# Patient Record
Sex: Female | Born: 2000 | Race: Black or African American | Hispanic: No | Marital: Single | Smoking: Never smoker
Health system: Southern US, Community
[De-identification: ages and names within clinical notes are randomized; demographics above are authoritative.]

---
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
## 2020-01-14 ENCOUNTER — Other Ambulatory Visit: Payer: Self-pay

## 2020-01-14 ENCOUNTER — Emergency Department
Admission: EM | Admit: 2020-01-14 | Discharge: 2020-01-14 | Disposition: A | Discharge status: Home / Self Care | Payer: Medicaid Other | Attending: Emergency Medicine | Admitting: Emergency Medicine

## 2020-01-14 ENCOUNTER — Emergency Department
Admission: EM | Admit: 2020-01-14 | Discharge: 2020-01-14 | Disposition: A | Discharge status: Home / Self Care | Payer: Medicaid Other | Source: Home / Self Care | Attending: Emergency Medicine | Admitting: Emergency Medicine

## 2020-01-14 ENCOUNTER — Emergency Department: Payer: Medicaid Other

## 2020-01-14 ENCOUNTER — Encounter: Payer: Self-pay | Admitting: Emergency Medicine

## 2020-01-14 DIAGNOSIS — S80212A Abrasion, left knee, initial encounter: Secondary | ICD-10-CM | POA: Diagnosis not present

## 2020-01-14 DIAGNOSIS — Y999 Unspecified external cause status: Secondary | ICD-10-CM | POA: Insufficient documentation

## 2020-01-14 DIAGNOSIS — S0990XA Unspecified injury of head, initial encounter: Secondary | ICD-10-CM | POA: Diagnosis not present

## 2020-01-14 DIAGNOSIS — S0083XA Contusion of other part of head, initial encounter: Secondary | ICD-10-CM

## 2020-01-14 DIAGNOSIS — Y929 Unspecified place or not applicable: Secondary | ICD-10-CM | POA: Insufficient documentation

## 2020-01-14 DIAGNOSIS — Z23 Encounter for immunization: Secondary | ICD-10-CM | POA: Insufficient documentation

## 2020-01-14 DIAGNOSIS — S0181XA Laceration without foreign body of other part of head, initial encounter: Secondary | ICD-10-CM | POA: Insufficient documentation

## 2020-01-14 DIAGNOSIS — Y939 Activity, unspecified: Secondary | ICD-10-CM | POA: Insufficient documentation

## 2020-01-14 DIAGNOSIS — S60511A Abrasion of right hand, initial encounter: Secondary | ICD-10-CM | POA: Insufficient documentation

## 2020-01-14 DIAGNOSIS — S40812A Abrasion of left upper arm, initial encounter: Secondary | ICD-10-CM | POA: Diagnosis not present

## 2020-01-14 DIAGNOSIS — S60512A Abrasion of left hand, initial encounter: Secondary | ICD-10-CM | POA: Diagnosis not present

## 2020-01-14 DIAGNOSIS — S40811A Abrasion of right upper arm, initial encounter: Secondary | ICD-10-CM | POA: Insufficient documentation

## 2020-01-14 DIAGNOSIS — S80211A Abrasion, right knee, initial encounter: Secondary | ICD-10-CM | POA: Insufficient documentation

## 2020-01-14 DIAGNOSIS — T07XXXA Unspecified multiple injuries, initial encounter: Secondary | ICD-10-CM

## 2020-01-14 LAB — POCT PREGNANCY, URINE: Preg Test, Ur: NEGATIVE

## 2020-01-14 MED ORDER — LIDOCAINE-EPINEPHRINE-TETRACAINE (LET) TOPICAL GEL
3.0000 mL | Freq: Once | TOPICAL | Status: AC
  Administered 2020-01-14: 3 mL via TOPICAL
  Filled 2020-01-14: qty 3

## 2020-01-14 MED ORDER — CEPHALEXIN 500 MG PO CAPS: 500.0000 mg | ORAL_CAPSULE | Freq: Three times a day (TID) | ORAL | 0 refills | Status: AC

## 2020-01-14 MED ORDER — TETANUS-DIPHTH-ACELL PERTUSSIS 5-2.5-18.5 LF-MCG/0.5 IM SUSP
0.5000 mL | Freq: Once | INTRAMUSCULAR | Status: AC
  Administered 2020-01-14: 0.5 mL via INTRAMUSCULAR
  Filled 2020-01-14: qty 0.5

## 2020-01-14 MED ORDER — LIDOCAINE HCL (PF) 1 % IJ SOLN
5.0000 mL | Freq: Once | INTRAMUSCULAR | Status: DC
  Filled 2020-01-14: qty 5

## 2020-01-14 NOTE — ED Triage Notes (Signed)
Pt called from [facility] to treatment room x's 3, no response 

## 2020-01-14 NOTE — ED Triage Notes (Signed)
Pt called from [facility] to treatment room, no response 

## 2020-01-14 NOTE — Discharge Instructions (Addendum)
Read head injury directions for information only.  Return to the emergency department if any severe worsening or problems in relationship to the information.  Your CT scan today was negative and no skull fracture was seen.  A prescription for Keflex was sent to the pharmacy to help prevent infection.  He will need to clean the abrasions daily with mild soap and water.  The laceration to your face was sutured with material that will come out on its own.  You do not need to have them removed at a doctor's office or urgent care.  You may take Tylenol as needed for pain, soreness or headache.
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
## 2020-01-14 NOTE — ED Notes (Signed)
Called for patient in waiting room.  No response.
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
## 2024-04-13 ENCOUNTER — Encounter: Payer: Self-pay | Admitting: *Deleted

## 2024-04-13 ENCOUNTER — Other Ambulatory Visit: Payer: Self-pay

## 2024-04-13 ENCOUNTER — Emergency Department
Admission: EM | Admit: 2024-04-13 | Discharge: 2024-04-13 | Disposition: A | Discharge status: Home / Self Care | Payer: Self-pay | Attending: Emergency Medicine | Admitting: Emergency Medicine

## 2024-04-13 DIAGNOSIS — U071 COVID-19: Secondary | ICD-10-CM | POA: Insufficient documentation

## 2024-04-13 LAB — RESP PANEL BY RT-PCR (RSV, FLU A&B, COVID)  RVPGX2
Influenza A by PCR: NEGATIVE
Influenza B by PCR: NEGATIVE
Resp Syncytial Virus by PCR: NEGATIVE
SARS Coronavirus 2 by RT PCR: POSITIVE — AB

## 2024-04-13 MED ORDER — PREDNISONE 20 MG PO TABS
60.0000 mg | ORAL_TABLET | Freq: Once | ORAL | Status: AC
  Administered 2024-04-13: 60 mg via ORAL
  Filled 2024-04-13: qty 3

## 2024-04-13 MED ORDER — PREDNISONE 10 MG (21) PO TBPK: ORAL_TABLET | ORAL | 0 refills | Status: AC

## 2024-04-13 MED ORDER — ALBUTEROL SULFATE HFA 108 (90 BASE) MCG/ACT IN AERS: 2.0000 | INHALATION_SPRAY | RESPIRATORY_TRACT | 1 refills | Status: AC | PRN

## 2024-04-13 NOTE — ED Provider Notes (Signed)
° °  [facility] Provider Note    None    (approximate)   History   Nasal Congestion   HPI  Chrishawna R Emeree Mahler is a 23 y.o. female with no significant past medical history and as listed in EMR presents to the emergency department for treatment and evaluation of runny nose, body aches, chills, nonproductive cough for about a week. Minimal relief with old albuterol inhaler.   Physical Exam    Vitals:   04/13/24 2136  BP: (!) 155/103  Pulse: 96  Resp: 20  Temp: 98.3 F (36.8 C)  SpO2: 100%    General: Awake, no distress.  CV:  Good peripheral perfusion.  Resp:  Normal effort.  Breath sounds clear to auscultation Abd:  No distention.  Other:     ED Results / Procedures / Treatments   Labs (all labs ordered are listed, but only abnormal results are displayed)  Labs Reviewed  RESP PANEL BY RT-PCR (RSV, FLU A&B, COVID)  [id number] - Abnormal; Notable for the following components:      Result Value   SARS Coronavirus 2 by RT PCR POSITIVE (*)    All other components within normal limits     EKG  Not indicated.   RADIOLOGY  Image and radiology report reviewed and interpreted by me. Radiology report consistent with the same.  Not indicated.  PROCEDURES:  Critical Care performed: No  Procedures   MEDICATIONS ORDERED IN ED:  Medications  predniSONE (DELTASONE) tablet 60 mg (has no administration in time range)     IMPRESSION / MDM / ASSESSMENT AND PLAN / ED COURSE   I have reviewed the triage note and vital signs. Vital signs: Hypertension noted    Differential diagnosis includes, but is not limited to, COVID, influenza, acute viral syndrome, pneumonia  Patient's presentation is most consistent with acute illness / injury with system symptoms.  23 year old female presenting to the emergency department for treatment and evaluation of viral symptoms as described in the HPI.  She was prescribed an inhaler and advised to use  over-the-counter cough medications.  The inhaler had been helping but it has now ran out and she feels her symptoms are not improving without it.  Respiratory panel shows she is positive for COVID.  Results discussed with the patient.  She is to use the inhaler as prescribed and take her steroid.  She was also advised she may continue use of the over-the-counter cough medication.  If symptoms are not improving over the week she is to see primary care if possible.  If symptoms change or worsen and she is unable to schedule an appointment she is to return to the emergency department.      FINAL CLINICAL IMPRESSION(S) / ED DIAGNOSES   Final diagnoses:  COVID     Rx / DC Orders   ED Discharge Orders          Ordered    predniSONE (STERAPRED UNI-PAK 21 TAB) 10 MG (21) TBPK tablet        04/13/24 2239    albuterol (VENTOLIN HFA) 108 (90 Base) MCG/ACT inhaler  Every 4 hours PRN        04/13/24 2239             Note:  This document was prepared using Dragon voice recognition software and may include unintentional dictation errors.   Herlinda Kirk NOVAK, FNP 04/13/24 2243    Willo Dunnings, MD 04/13/24 2328

## 2024-04-13 NOTE — Discharge Instructions (Addendum)
 Follow-up with your primary care provider if you are not improving over the week.  Take the prednisone as prescribed and until finished.  Use the inhaler every 4 hours for chest tightness, wheezing, or persistent cough.  You  may also use the over-the-counter cough medicine as previously advised.  Return to the emergency department for symptoms of change or worsen if unable to schedule appointment.

## 2024-04-13 NOTE — ED Triage Notes (Signed)
 Runny nose, body aches, chills, cough (non productive) for about 1 week. Has been using an old inhaler with some relief.
